# Patient Record
Sex: Male | Born: 1980 | Race: Black or African American | Hispanic: No | Marital: Single
Health system: Southern US, Community
[De-identification: ages and names within clinical notes are randomized; demographics above are authoritative.]

---

## 2004-04-04 ENCOUNTER — Ambulatory Visit: Payer: Self-pay | Admitting: Internal Medicine

## 2004-04-14 ENCOUNTER — Ambulatory Visit (HOSPITAL_COMMUNITY): Admission: RE | Admit: 2004-04-14 | Discharge: 2004-04-14 | Payer: Self-pay | Admitting: Internal Medicine

## 2010-08-18 ENCOUNTER — Ambulatory Visit: Payer: Self-pay | Admitting: Adult Health

## 2010-11-28 ENCOUNTER — Inpatient Hospital Stay (HOSPITAL_COMMUNITY)
Admission: EM | Admit: 2010-11-28 | Discharge: 2010-12-19 | DRG: 975 | Disposition: A | Discharge status: Home / Self Care | Payer: Medicaid Other | Attending: Internal Medicine | Admitting: Internal Medicine

## 2010-11-28 ENCOUNTER — Encounter (HOSPITAL_COMMUNITY): Payer: Self-pay

## 2010-11-28 ENCOUNTER — Emergency Department (HOSPITAL_COMMUNITY): Payer: Medicaid Other

## 2010-11-28 ENCOUNTER — Inpatient Hospital Stay (HOSPITAL_COMMUNITY): Payer: Medicaid Other

## 2010-11-28 DIAGNOSIS — D709 Neutropenia, unspecified: Secondary | ICD-10-CM | POA: Diagnosis not present

## 2010-11-28 DIAGNOSIS — T375X5A Adverse effect of antiviral drugs, initial encounter: Secondary | ICD-10-CM | POA: Diagnosis not present

## 2010-11-28 DIAGNOSIS — K2990 Gastroduodenitis, unspecified, without bleeding: Secondary | ICD-10-CM | POA: Diagnosis present

## 2010-11-28 DIAGNOSIS — K2210 Ulcer of esophagus without bleeding: Secondary | ICD-10-CM | POA: Diagnosis present

## 2010-11-28 DIAGNOSIS — J189 Pneumonia, unspecified organism: Secondary | ICD-10-CM | POA: Diagnosis present

## 2010-11-28 DIAGNOSIS — Z79899 Other long term (current) drug therapy: Secondary | ICD-10-CM

## 2010-11-28 DIAGNOSIS — R5081 Fever presenting with conditions classified elsewhere: Secondary | ICD-10-CM | POA: Diagnosis present

## 2010-11-28 DIAGNOSIS — B20 Human immunodeficiency virus [HIV] disease: Secondary | ICD-10-CM

## 2010-11-28 DIAGNOSIS — R7402 Elevation of levels of lactic acid dehydrogenase (LDH): Secondary | ICD-10-CM | POA: Diagnosis not present

## 2010-11-28 DIAGNOSIS — D509 Iron deficiency anemia, unspecified: Secondary | ICD-10-CM | POA: Diagnosis present

## 2010-11-28 DIAGNOSIS — C464 Kaposi's sarcoma of gastrointestinal sites: Secondary | ICD-10-CM | POA: Diagnosis present

## 2010-11-28 DIAGNOSIS — G9349 Other encephalopathy: Secondary | ICD-10-CM | POA: Diagnosis present

## 2010-11-28 DIAGNOSIS — K2970 Gastritis, unspecified, without bleeding: Secondary | ICD-10-CM | POA: Diagnosis present

## 2010-11-28 DIAGNOSIS — B370 Candidal stomatitis: Secondary | ICD-10-CM | POA: Diagnosis present

## 2010-11-28 DIAGNOSIS — C460 Kaposi's sarcoma of skin: Secondary | ICD-10-CM | POA: Diagnosis present

## 2010-11-28 DIAGNOSIS — B259 Cytomegaloviral disease, unspecified: Secondary | ICD-10-CM | POA: Diagnosis present

## 2010-11-28 DIAGNOSIS — C469 Kaposi's sarcoma, unspecified: Secondary | ICD-10-CM

## 2010-11-28 DIAGNOSIS — I498 Other specified cardiac arrhythmias: Secondary | ICD-10-CM | POA: Diagnosis present

## 2010-11-28 DIAGNOSIS — R195 Other fecal abnormalities: Secondary | ICD-10-CM | POA: Diagnosis present

## 2010-11-28 DIAGNOSIS — K209 Esophagitis, unspecified without bleeding: Secondary | ICD-10-CM | POA: Diagnosis present

## 2010-11-28 DIAGNOSIS — R7401 Elevation of levels of liver transaminase levels: Secondary | ICD-10-CM | POA: Diagnosis not present

## 2010-11-28 DIAGNOSIS — E236 Other disorders of pituitary gland: Secondary | ICD-10-CM | POA: Diagnosis not present

## 2010-11-28 DIAGNOSIS — F4321 Adjustment disorder with depressed mood: Secondary | ICD-10-CM | POA: Diagnosis present

## 2010-11-28 LAB — OCCULT BLOOD, POC DEVICE: Fecal Occult Bld: POSITIVE

## 2010-11-28 LAB — CSF CELL COUNT WITH DIFFERENTIAL
RBC Count, CSF: 242 /mm3 — ABNORMAL HIGH
RBC Count, CSF: 3 /mm3 — ABNORMAL HIGH
Tube #: 1
Tube #: 4
WBC, CSF: 2 /mm3 (ref 0–5)
WBC, CSF: 2 /mm3 (ref 0–5)

## 2010-11-28 LAB — URINE MICROSCOPIC-ADD ON

## 2010-11-28 LAB — IRON AND TIBC
Iron: <10 ug/dL — ABNORMAL LOW (ref 42–135)
UIBC: 160 ug/dL

## 2010-11-28 LAB — DIFFERENTIAL
Basophils Absolute: 0 10*3/uL (ref 0.0–0.1)
Basophils Relative: 0 % (ref 0–1)
Eosinophils Absolute: 0 10*3/uL (ref 0.0–0.7)
Eosinophils Relative: 0 % (ref 0–5)
Lymphocytes Relative: 18 % (ref 12–46)
Lymphs Abs: 1 10*3/uL (ref 0.7–4.0)
Monocytes Absolute: 0.6 10*3/uL (ref 0.1–1.0)
Monocytes Relative: 11 % (ref 3–12)
Neutro Abs: 3.9 10*3/uL (ref 1.7–7.7)

## 2010-11-28 LAB — BASIC METABOLIC PANEL
BUN: 15 mg/dL (ref 6–23)
CO2: 35 mEq/L — ABNORMAL HIGH (ref 19–32)
Calcium: 9.7 mg/dL (ref 8.4–10.5)
Chloride: 84 mEq/L — ABNORMAL LOW (ref 96–112)
Creatinine, Ser: 1.16 mg/dL (ref 0.50–1.35)
GFR calc Af Amer: >60 mL/min (ref >60)
GFR calc non Af Amer: >60 mL/min (ref >60)
Glucose, Bld: 116 mg/dL — ABNORMAL HIGH (ref 70–99)
Potassium: 3 mEq/L — ABNORMAL LOW (ref 3.5–5.1)
Sodium: 129 mEq/L — ABNORMAL LOW (ref 135–145)

## 2010-11-28 LAB — RAPID URINE DRUG SCREEN, HOSP PERFORMED
Amphetamines: NOT DETECTED
Barbiturates: NOT DETECTED
Benzodiazepines: NOT DETECTED
Cocaine: NOT DETECTED
Opiates: NOT DETECTED
Tetrahydrocannabinol: NOT DETECTED

## 2010-11-28 LAB — LACTIC ACID, PLASMA: Lactic Acid, Venous: 1.4 mmol/L (ref 0.5–2.2)

## 2010-11-28 LAB — CBC
HCT: 23.6 % — ABNORMAL LOW (ref 39.0–52.0)
Hemoglobin: 7.3 g/dL — ABNORMAL LOW (ref 13.0–17.0)
MCH: 25.3 pg — ABNORMAL LOW (ref 26.0–34.0)
MCHC: 30.9 g/dL (ref 30.0–36.0)
MCV: 81.9 fL (ref 78.0–100.0)
Platelets: 398 10*3/uL (ref 150–400)
RBC: 2.88 MIL/uL — ABNORMAL LOW (ref 4.22–5.81)
RDW: 16.8 % — ABNORMAL HIGH (ref 11.5–15.5)
WBC: 5.5 10*3/uL (ref 4.0–10.5)

## 2010-11-28 LAB — HIV ANTIBODY (ROUTINE TESTING W REFLEX): HIV: REACTIVE — AB

## 2010-11-28 LAB — GRAM STAIN

## 2010-11-28 LAB — CK: Total CK: 786 U/L — ABNORMAL HIGH (ref 7–232)

## 2010-11-28 LAB — HEMOGLOBIN AND HEMATOCRIT, BLOOD: Hemoglobin: 5.8 g/dL — CL (ref 13.0–17.0)

## 2010-11-28 LAB — URINALYSIS, ROUTINE W REFLEX MICROSCOPIC
Glucose, UA: NEGATIVE mg/dL
Leukocytes, UA: NEGATIVE
Nitrite: NEGATIVE
Protein, ur: 100 mg/dL — AB
Specific Gravity, Urine: 1.023 (ref 1.005–1.030)
Urobilinogen, UA: 1 mg/dL (ref 0.0–1.0)
pH: 6 (ref 5.0–8.0)

## 2010-11-28 LAB — HEPATIC FUNCTION PANEL
ALT: 12 U/L (ref 0–53)
AST: 41 U/L — ABNORMAL HIGH (ref 0–37)
Albumin: 2.4 g/dL — ABNORMAL LOW (ref 3.5–5.2)
Alkaline Phosphatase: 32 U/L — ABNORMAL LOW (ref 39–117)
Bilirubin, Direct: 0.2 mg/dL (ref 0.0–0.3)
Indirect Bilirubin: 0.2 mg/dL — ABNORMAL LOW (ref 0.3–0.9)
Total Bilirubin: 0.4 mg/dL (ref 0.3–1.2)
Total Protein: 6.8 g/dL (ref 6.0–8.3)

## 2010-11-28 LAB — RETICULOCYTES
RBC.: 2.27 MIL/uL — ABNORMAL LOW (ref 4.22–5.81)
Retic Count, Absolute: 40.9 10*3/uL (ref 19.0–186.0)

## 2010-11-28 LAB — GLUCOSE, CAPILLARY: Glucose-Capillary: 106 mg/dL — ABNORMAL HIGH (ref 70–99)

## 2010-11-28 LAB — TSH: TSH: 0.732 u[IU]/mL (ref 0.350–4.500)

## 2010-11-28 LAB — PROTEIN, CSF: Total  Protein, CSF: 32 mg/dL (ref 15–45)

## 2010-11-28 LAB — ABO/RH: ABO/RH(D): O POS

## 2010-11-28 LAB — GLUCOSE, CSF: Glucose, CSF: 54 mg/dL (ref 43–76)

## 2010-11-28 LAB — FERRITIN: Ferritin: 2450 ng/mL — ABNORMAL HIGH (ref 22–322)

## 2010-11-28 MED ORDER — GADOBENATE DIMEGLUMINE 529 MG/ML IV SOLN
17.0000 mL | Freq: Once | INTRAVENOUS | Status: AC | PRN
  Administered 2010-11-28: 17 mL via INTRAVENOUS

## 2010-11-29 ENCOUNTER — Inpatient Hospital Stay (HOSPITAL_COMMUNITY): Payer: Medicaid Other

## 2010-11-29 LAB — CROSSMATCH
ABO/RH(D): O POS
Unit division: 0

## 2010-11-29 LAB — HEMOGLOBIN AND HEMATOCRIT, BLOOD
HCT: 26.6 % — ABNORMAL LOW (ref 39.0–52.0)
Hemoglobin: 8.4 g/dL — ABNORMAL LOW (ref 13.0–17.0)

## 2010-11-29 LAB — GC/CHLAMYDIA PROBE AMP, URINE: GC Probe Amp, Urine: NEGATIVE

## 2010-11-29 LAB — HERPES SIMPLEX VIRUS(HSV) DNA BY PCR

## 2010-11-29 MED ORDER — IOHEXOL 300 MG/ML  SOLN
125.0000 mL | Freq: Once | INTRAMUSCULAR | Status: AC | PRN
  Administered 2010-11-29: 125 mL via INTRAVENOUS

## 2010-11-30 LAB — CBC
HCT: 26.4 % — ABNORMAL LOW (ref 39.0–52.0)
Hemoglobin: 8.4 g/dL — ABNORMAL LOW (ref 13.0–17.0)
MCHC: 31.8 g/dL (ref 30.0–36.0)
RBC: 3.1 MIL/uL — ABNORMAL LOW (ref 4.22–5.81)
WBC: 3.5 10*3/uL — ABNORMAL LOW (ref 4.0–10.5)

## 2010-11-30 LAB — DIFFERENTIAL
Basophils Absolute: 0 10*3/uL (ref 0.0–0.1)
Eosinophils Relative: 0 % (ref 0–5)
Lymphocytes Relative: 11 % — ABNORMAL LOW (ref 12–46)
Monocytes Relative: 9 % (ref 3–12)
Neutrophils Relative %: 80 % — ABNORMAL HIGH (ref 43–77)

## 2010-11-30 LAB — BASIC METABOLIC PANEL
CO2: 31 mEq/L (ref 19–32)
Chloride: 99 mEq/L (ref 96–112)
Creatinine, Ser: 0.66 mg/dL (ref 0.50–1.35)
GFR calc Af Amer: >60 mL/min (ref >60)
Sodium: 136 mEq/L (ref 135–145)

## 2010-11-30 LAB — PROTIME-INR
INR: 1.07 (ref 0.00–1.49)
Prothrombin Time: 14.1 seconds (ref 11.6–15.2)

## 2010-12-01 DIAGNOSIS — F329 Major depressive disorder, single episode, unspecified: Secondary | ICD-10-CM

## 2010-12-01 DIAGNOSIS — B20 Human immunodeficiency virus [HIV] disease: Secondary | ICD-10-CM

## 2010-12-01 DIAGNOSIS — M7989 Other specified soft tissue disorders: Secondary | ICD-10-CM

## 2010-12-01 DIAGNOSIS — C469 Kaposi's sarcoma, unspecified: Secondary | ICD-10-CM

## 2010-12-01 DIAGNOSIS — F3289 Other specified depressive episodes: Secondary | ICD-10-CM

## 2010-12-01 LAB — COMPREHENSIVE METABOLIC PANEL
BUN: 11 mg/dL (ref 6–23)
CO2: 29 mEq/L (ref 19–32)
Calcium: 9 mg/dL (ref 8.4–10.5)
Chloride: 101 mEq/L (ref 96–112)
Creatinine, Ser: 0.71 mg/dL (ref 0.50–1.35)
GFR calc non Af Amer: >60 mL/min (ref >60)
Total Bilirubin: 0.4 mg/dL (ref 0.3–1.2)

## 2010-12-01 LAB — CBC
HCT: 25.8 % — ABNORMAL LOW (ref 39.0–52.0)
MCH: 27.2 pg (ref 26.0–34.0)
MCV: 86.6 fL (ref 78.0–100.0)
RBC: 2.98 MIL/uL — ABNORMAL LOW (ref 4.22–5.81)
RDW: 18.3 % — ABNORMAL HIGH (ref 11.5–15.5)
WBC: 2.3 10*3/uL — ABNORMAL LOW (ref 4.0–10.5)

## 2010-12-01 LAB — T-HELPER CELLS (CD4) COUNT (NOT AT ARMC): CD4 T Cell Abs: 10 uL — ABNORMAL LOW (ref 400–2700)

## 2010-12-01 LAB — B. BURGDORFI ANTIBODIES: B burgdorferi Ab IgG+IgM: 0.22 {ISR}

## 2010-12-01 LAB — CSF CULTURE W GRAM STAIN

## 2010-12-02 ENCOUNTER — Inpatient Hospital Stay (HOSPITAL_COMMUNITY): Payer: Medicaid Other

## 2010-12-02 LAB — HIV-1 RNA ULTRAQUANT REFLEX TO GENTYP+: HIV-1 RNA Quant, Log: 4.92 {Log} — ABNORMAL HIGH (ref <1.30)

## 2010-12-02 LAB — CBC
HCT: 25.2 % — ABNORMAL LOW (ref 39.0–52.0)
Hemoglobin: 8.1 g/dL — ABNORMAL LOW (ref 13.0–17.0)
MCH: 27.3 pg (ref 26.0–34.0)
MCHC: 32.1 g/dL (ref 30.0–36.0)
MCV: 84.8 fL (ref 78.0–100.0)

## 2010-12-02 LAB — COMPREHENSIVE METABOLIC PANEL
AST: 25 U/L (ref 0–37)
BUN: 10 mg/dL (ref 6–23)
CO2: 27 mEq/L (ref 19–32)
Chloride: 96 mEq/L (ref 96–112)
Creatinine, Ser: 0.65 mg/dL (ref 0.50–1.35)
GFR calc Af Amer: >60 mL/min (ref >60)
GFR calc non Af Amer: >60 mL/min (ref >60)
Glucose, Bld: 101 mg/dL — ABNORMAL HIGH (ref 70–99)
Total Bilirubin: 0.3 mg/dL (ref 0.3–1.2)

## 2010-12-02 LAB — HIV 1/2 CONFIRMATION: HIV-1 antibody: POSITIVE

## 2010-12-02 LAB — HEMOGLOBINOPATHY EVALUATION
Hemoglobin Other: 0 % (ref 0.0–0.0)
Hgb A2 Quant: 3.3 % — ABNORMAL HIGH (ref 2.2–3.2)

## 2010-12-02 LAB — OCCULT BLOOD X 1 CARD TO LAB, STOOL: Fecal Occult Bld: NEGATIVE

## 2010-12-03 ENCOUNTER — Other Ambulatory Visit: Payer: Self-pay | Admitting: Gastroenterology

## 2010-12-03 ENCOUNTER — Other Ambulatory Visit (HOSPITAL_COMMUNITY): Payer: Self-pay

## 2010-12-03 ENCOUNTER — Other Ambulatory Visit (INDEPENDENT_AMBULATORY_CARE_PROVIDER_SITE_OTHER): Payer: Self-pay | Admitting: General Surgery

## 2010-12-03 DIAGNOSIS — C469 Kaposi's sarcoma, unspecified: Secondary | ICD-10-CM

## 2010-12-03 DIAGNOSIS — R229 Localized swelling, mass and lump, unspecified: Secondary | ICD-10-CM

## 2010-12-03 LAB — COMPREHENSIVE METABOLIC PANEL
ALT: 14 U/L (ref 0–53)
Alkaline Phosphatase: 46 U/L (ref 39–117)
CO2: 26 mEq/L (ref 19–32)
Calcium: 9.1 mg/dL (ref 8.4–10.5)
Chloride: 95 mEq/L — ABNORMAL LOW (ref 96–112)
GFR calc Af Amer: >60 mL/min (ref >60)
GFR calc non Af Amer: >60 mL/min (ref >60)
Glucose, Bld: 97 mg/dL (ref 70–99)
Sodium: 129 mEq/L — ABNORMAL LOW (ref 135–145)
Total Bilirubin: 0.4 mg/dL (ref 0.3–1.2)

## 2010-12-03 LAB — CBC
Hemoglobin: 8.4 g/dL — ABNORMAL LOW (ref 13.0–17.0)
MCH: 26.3 pg (ref 26.0–34.0)
RBC: 3.19 MIL/uL — ABNORMAL LOW (ref 4.22–5.81)
WBC: 3.4 10*3/uL — ABNORMAL LOW (ref 4.0–10.5)

## 2010-12-03 LAB — OCCULT BLOOD X 1 CARD TO LAB, STOOL: Fecal Occult Bld: POSITIVE

## 2010-12-04 ENCOUNTER — Encounter (HOSPITAL_COMMUNITY): Payer: Self-pay | Admitting: Radiology

## 2010-12-04 ENCOUNTER — Inpatient Hospital Stay (HOSPITAL_COMMUNITY): Payer: Medicaid Other

## 2010-12-04 DIAGNOSIS — B20 Human immunodeficiency virus [HIV] disease: Secondary | ICD-10-CM

## 2010-12-04 LAB — CULTURE, BLOOD (ROUTINE X 2)
Culture  Setup Time: 201207130916
Culture: NO GROWTH

## 2010-12-04 LAB — CBC
HCT: 22.8 % — ABNORMAL LOW (ref 39.0–52.0)
Hemoglobin: 7.5 g/dL — ABNORMAL LOW (ref 13.0–17.0)
MCHC: 32.9 g/dL (ref 30.0–36.0)
MCV: 82.9 fL (ref 78.0–100.0)
RDW: 18 % — ABNORMAL HIGH (ref 11.5–15.5)

## 2010-12-04 LAB — DIFFERENTIAL
Basophils Absolute: 0 10*3/uL (ref 0.0–0.1)
Basophils Relative: 0 % (ref 0–1)
Eosinophils Absolute: 0 10*3/uL (ref 0.0–0.7)
Lymphocytes Relative: 14 % (ref 12–46)
Lymphs Abs: 0.4 10*3/uL — ABNORMAL LOW (ref 0.7–4.0)
Monocytes Absolute: 0.4 10*3/uL (ref 0.1–1.0)
Neutro Abs: 1.9 10*3/uL (ref 1.7–7.7)

## 2010-12-04 LAB — COMPREHENSIVE METABOLIC PANEL
Albumin: 1.9 g/dL — ABNORMAL LOW (ref 3.5–5.2)
Alkaline Phosphatase: 37 U/L — ABNORMAL LOW (ref 39–117)
BUN: 8 mg/dL (ref 6–23)
Creatinine, Ser: 0.72 mg/dL (ref 0.50–1.35)
GFR calc Af Amer: >60 mL/min (ref >60)
Glucose, Bld: 100 mg/dL — ABNORMAL HIGH (ref 70–99)
Total Bilirubin: 0.3 mg/dL (ref 0.3–1.2)
Total Protein: 5.9 g/dL — ABNORMAL LOW (ref 6.0–8.3)

## 2010-12-04 MED ORDER — TECHNETIUM TC 99M-LABELED RED BLOOD CELLS IV KIT
24.1000 | PACK | Freq: Once | INTRAVENOUS | Status: AC | PRN
  Administered 2010-12-04: 24.1 via INTRAVENOUS

## 2010-12-05 ENCOUNTER — Other Ambulatory Visit: Payer: Self-pay | Admitting: Interventional Radiology

## 2010-12-05 ENCOUNTER — Inpatient Hospital Stay (HOSPITAL_COMMUNITY): Payer: Medicaid Other

## 2010-12-05 LAB — COMPREHENSIVE METABOLIC PANEL
ALT: 10 U/L (ref 0–53)
AST: 23 U/L (ref 0–37)
Alkaline Phosphatase: 45 U/L (ref 39–117)
CO2: 23 mEq/L (ref 19–32)
Chloride: 98 mEq/L (ref 96–112)
GFR calc non Af Amer: >60 mL/min (ref >60)
Glucose, Bld: 110 mg/dL — ABNORMAL HIGH (ref 70–99)
Sodium: 127 mEq/L — ABNORMAL LOW (ref 135–145)
Total Bilirubin: 0.2 mg/dL — ABNORMAL LOW (ref 0.3–1.2)

## 2010-12-05 LAB — CBC
HCT: 26.3 % — ABNORMAL LOW (ref 39.0–52.0)
Hemoglobin: 8.7 g/dL — ABNORMAL LOW (ref 13.0–17.0)
MCV: 82.2 fL (ref 78.0–100.0)
RBC: 3.2 MIL/uL — ABNORMAL LOW (ref 4.22–5.81)
WBC: 3.5 10*3/uL — ABNORMAL LOW (ref 4.0–10.5)

## 2010-12-05 LAB — CARDIAC PANEL(CRET KIN+CKTOT+MB+TROPI)
Relative Index: INVALID (ref 0.0–2.5)
Troponin I: <0.3 ng/mL (ref <0.30)

## 2010-12-05 LAB — CROSSMATCH
ABO/RH(D): O POS
Antibody Screen: NEGATIVE
Unit division: 0

## 2010-12-05 LAB — HERPES SIMPLEX VIRUS CULTURE

## 2010-12-05 LAB — TSH: TSH: 0.346 u[IU]/mL — ABNORMAL LOW (ref 0.350–4.500)

## 2010-12-05 NOTE — Progress Notes (Signed)
[name], [name]              ACCOUNT NO.:  [id number]  MEDICAL RECORD NO.:  [id number]  LOCATION:  1511                         FACILITY:  [facility]  PHYSICIAN:  Pleas Koch, MD        DATE OF BIRTH:  [birth year]                                PROGRESS NOTE   DIAGNOSES TO DATE: 1. 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  Subtype undertermined. 2. 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 3. 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 4. Multimodal etiology to anemia.  MEDICATIONS TO DATE: 1. Atripla 1 tablet q.h.s. 2. Ferrous sulfate 650 daily. 3. Fluconazole 100 mg daily. 4. Sucralfate 1 tablet q.i.d. 5. Tylenol 650 p.r.n. 6. Tramadol 50 q.8 p.r.n.Marland Kitchen  PERTINENT IMAGING STUDIES: CT head on November 28, 2010, showed no acute evidence of intracranial hemorrhage, mass lesion or acute infarct.  MRA of head on November 28, 2010, showed background pattern of brain atrophy.  Restrict effusion, perhaps low-level enhancement and region of lateral ventricle, more than right could represent ventriculitis or ependymal inflammation.  This could be due to atypical organism including cytomegalovirus.  Paranasal sinus inflammation including mastoid air cells on the right.  CT contrast of the groin on November 29, 2010, showed subcutaneous edema along the inguinal region.  Do not discern overadenopathy, although subcutaneous edema could be manifestation of hepatic obstruction that can occur in Kaposi sarcoma. Nonspecific hypodense lesion, L4 vertebral body on the right, possibly manifestation of Kaposi sarcoma, cannot exclude if this could be simply a meningioma.  CT chest showed a patchy ground-glass opacity causing mosaic attenuation, pain-associated nodularity.  Although appearance does not have peribronchial-perivascular nodularity affecting Kaposi sarcoma, this can occasionally present with ground-glass opacities  indicating hemorrhage around micronodules.  HISTORY: This is an unfortunate 30 year old male who presented to [facility] ED with altered mental status.  He was found outside the house late in the evening wandering around the garden.  He was not eating and appeared to be confused.  Mother who lives away was contacted and got him to the hospital.  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  He has labelled defensive attitude.  He did complain of right buttock area rash, it is painful but is crusted over.  He had a hemoglobin of 7.3 initially.  Urinalysis negative.  Serum sodium was 129, potassium 3.0. He also presented with fever of 103 and vitals otherwise stable.  LP was performed, partial result showed 2 WBCs, 242 RBCs.  The patient initially denied but subsequently admitted to be homosexual and he has known that he has had HIV since 2000, never wanted treatment for same, never pursued the same.  HOSPITAL COURSE ACCORDING TO ISSUES: 1. HIV/AIDS.  ID is on board.  He is currently on day #3 of Atripla,     day #4 of Diflucan for esophageal candidiasis.  He will need to     continue on HAART.  His CD-4 count has returned at 10.  He has had     a multitude of tests including  CSF studies as well as viral studies     which have been turned out to be negative.  We are still awaiting a     genotype. 2. Kaposi sarcoma.  We enlisted the help of Dr. Myna Hidalgo with Oncology.     He has seen the patient and has recommended skin biopsy which we     will hold on for now. 3. Anemia.  The patient is significantly anemic still and guaiacs     stool positive x1.  I have spoken with Dr. Bosie Clos of     Gastroenterology who agrees that the patient may need a scope.  He     has had iron level less than 10 and this likely is secondary to his     significant immunocompromised self with possible invasion of the     bone  marrow with Kaposi.  The patient may benefit from being on     some specific chemotherapy for this.  Dr. Myna Hidalgo to decide on the     same, in terms of Doxil. 4. Depression.  We consulted Dr. Rogers Blocker of Psychiatry who recommend     outpatient followup for the same.  This patient will need possibly     to have his care coordinated as the patient's family has determined     that it would be best to keep him with them at [facility].  The patient doing fairly well on day of review, December 02, 2010, although he had a slight temperature of 99.  He also had some hyponatremia on his labs at 131.  He said he has been coughing and we are going to get a chest x-ray for that.  Chest was clinically clear.  I did not hear any focal fremitus.  I did hear some crackles in the right lower lobe. Abdomen is soft, nontender, nondistended.  He has a rash on his bottom which I am getting wound care consult for.  He was a little bit confused yesterday but has now been sober today.  Per recommendations of ID, I am adding azithromycin and Bactrim to his treatment for prophylaxis.  The patient will be followed on day-to-day basis.  Dr. Janee Morn will be seeing him tomorrow. ______________________________ Pleas Koch, MD     JS/MEDQ  D:  12/02/2010  T:  12/02/2010  Job:  [id number]  Electronically Signed by Pleas Koch MD on 12/05/2010 09:25:45 PM

## 2010-12-06 DIAGNOSIS — B20 Human immunodeficiency virus [HIV] disease: Secondary | ICD-10-CM

## 2010-12-06 DIAGNOSIS — F329 Major depressive disorder, single episode, unspecified: Secondary | ICD-10-CM

## 2010-12-06 DIAGNOSIS — F3289 Other specified depressive episodes: Secondary | ICD-10-CM

## 2010-12-06 DIAGNOSIS — C469 Kaposi's sarcoma, unspecified: Secondary | ICD-10-CM

## 2010-12-06 LAB — DIFFERENTIAL
Basophils Relative: 0 % (ref 0–1)
Eosinophils Relative: 1 % (ref 0–5)
Lymphs Abs: 0.5 10*3/uL — ABNORMAL LOW (ref 0.7–4.0)
Monocytes Absolute: 0.2 10*3/uL (ref 0.1–1.0)

## 2010-12-06 LAB — CBC
HCT: 27.5 % — ABNORMAL LOW (ref 39.0–52.0)
Hemoglobin: 8.9 g/dL — ABNORMAL LOW (ref 13.0–17.0)
MCV: 82.3 fL (ref 78.0–100.0)
RDW: 18 % — ABNORMAL HIGH (ref 11.5–15.5)
WBC: 4.3 10*3/uL (ref 4.0–10.5)

## 2010-12-06 LAB — COMPREHENSIVE METABOLIC PANEL
ALT: 11 U/L (ref 0–53)
AST: 26 U/L (ref 0–37)
Albumin: 1.8 g/dL — ABNORMAL LOW (ref 3.5–5.2)
Alkaline Phosphatase: 59 U/L (ref 39–117)
BUN: 7 mg/dL (ref 6–23)
Chloride: 100 mEq/L (ref 96–112)
Potassium: 4 mEq/L (ref 3.5–5.1)
Sodium: 129 mEq/L — ABNORMAL LOW (ref 135–145)
Total Bilirubin: 0.2 mg/dL — ABNORMAL LOW (ref 0.3–1.2)

## 2010-12-06 LAB — CARDIAC PANEL(CRET KIN+CKTOT+MB+TROPI)
Total CK: 56 U/L (ref 7–232)
Troponin I: <0.3 ng/mL (ref <0.30)

## 2010-12-06 NOTE — Consult Note (Signed)
NAMEKENARD, [name]              ACCOUNT NO.:  [id number]  MEDICAL RECORD NO.:  [id number]  LOCATION:                                 FACILITY:  PHYSICIAN:  Josph Macho, M.D.  DATE OF BIRTH:  06-Jan-1981  DATE OF CONSULTATION:  11/28/2010 DATE OF DISCHARGE:                                CONSULTATION   REFERRING PHYSICIAN:  Pleas Koch, MD.  REASON FOR CONSULTATION: 1. AIDS associated Kaposi sarcoma. 2. Severe anemia.  HISTORY OF PRESENT ILLNESS:  [name] is a 30 year old African gentleman.  He is a homosexual.  He has had about 50 partners.  He apparently tested positive for HIV back in 2003.  He has not had any treatment for this.  He unfortunately came in with weight loss, fever, and mental status changes.  He did have an LP done.  He had routine MRI of the brain done.  The MRI of the brain showed some atrophy.  There was some low-level enhancement noted.  No obvious mass or bleed was noted.  He had lab work done.  He was admitted on the 13th.  He was noted to have occult blood in his stool.  A CBC was done, showed a white count 5.5, hemoglobin 7.2, hematocrit 23.6, platelet count 398.  MCV was 82. He had a reticulocyte count which was low at 1.8%, corrected and this be less than 1%.  He had metabolic panel which showed BUN of 15, creatinine 1.1.  Sodium 129, potassium 3.0.  His LFTs were normal.  Iron studies showed total iron less than 10.  His TSH was 0.732.  His ferritin was 2450, this is likely reflective of acute phase reactant.  He was noted to have some suspicious lesions on the skin.  He was seen by Dr. Ninetta Lights.  Dr. Ninetta Lights felt that these were all consistent with Kaposi.  He also was noted to have thrush.  He subsequently was asked to be seen by Hematology.  We are now seeing him.  There is some degree of encephalopathy.  He recently lost his job. Apparently he has been "depressed".  PAST MEDICAL HISTORY:  Relatively, I think,  unremarkable.  ALLERGIES:  None.  ADMISSION MEDICATIONS:  None.  SOCIAL HISTORY:  Negative for tobacco use.  There is no alcohol use.  He denies any recreational drug use.  FAMILY HISTORY:  Remarkable for diabetes.  REVIEW OF SYSTEMS:  As stated in history of present illness.  PHYSICAL EXAMINATION:  GENERAL:  This is a fairly well-developed, well- nourished African-American gentleman, in no obvious distress. VITAL SIGNS:  Temperature 98, pulse 98, respiratory rate 18, and blood pressure 119/74. HEENT:  Head and neck exam shows normocephalic and atraumatic skull. There are no ocular or oral lesions.  There is no scleral icterus.  He does have significant thrush.  I do not detect any obvious adenopathy in his neck. LUNGS:  His lungs are clear bilaterally. CARDIAC EXAM:  Regular rate and rhythm with normal S1, S2.  There are no murmurs, rubs or bruits.  Axillary exam shows no bilateral axillary adenopathy. ABDOMEN:  Abdominal exam is soft with good bowel sounds.  There is no fluid wave.  There is no abdominal mass.  There is no palpable hepatosplenomegaly. EXTREMITIES:  Showed some muscle atrophy in upper and lower extremities. SKIN EXAM:  Does show scattered violaceous plaques.  He has some on his face.  He has some on his extremities.  There are a couple on his gluteal region. NEUROLOGICAL EXAM:  Showed little bit of flat affect.  He is somewhat oriented and alert.  On his peripheral smear, he has marked anisocytosis.  He has couple enucleated red cells.  He has moderate microcytic red cells.  There is no hypersegmented polys.  I do not see any blasts.  His platelets are adequate in number and size.  He has few large platelets.  IMPRESSION:  [name] 30 year old gentleman with what certainly appears to be age-associated disease.  He apparently was HIV positive back in 2003.  It is amazing how he has gotten this long without having more in the way of problems.  He clearly  has fulfilled criteria for AIDS at this point in time.  I suspect that he has Kaposi sarcoma.  I think we will have to get Surgery to biopsy one of these lesions.  He is heme positive.  I think he need to be scoped from upper endoscopy and also colonoscopy.  We will see if he has Kaposi within his GI tract. He also may need to have capsule endoscopy to look at his small bowel.  I suspect that he is quite iron deficient.  However, he is still going to need to have a bone marrow biopsy done to make sure there is no opportunistic infection, malignancy, Kaposi, etc. in his bone marrow. 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  I think he is going to get a CT scan of his neck, chest, abdomen and pelvis looking for any evidence of Kaposi and also other AIDS associated malignancies.  I think once he is on appropriate  HAART  therapy, he will start improving.  It is typical that the Kaposi lesion regress with AIDS therapy alone.  There was one randomized study that did show that the addition of chemotherapy (Doxil) with HAART  therapy is more effective.  We will certainly follow along as necessary.  There is certainly an extensive workup that does need to be undertaken.     Josph Macho, M.D.     PRE/MEDQ  D:  11/28/2010  T:  11/28/2010  Job:  [id number]  Electronically Signed by Arlan Organ  on 12/06/2010 09:13:23 AM

## 2010-12-07 LAB — CBC
MCH: 27.1 pg (ref 26.0–34.0)
MCHC: 32.8 g/dL (ref 30.0–36.0)
MCV: 82.5 fL (ref 78.0–100.0)
Platelets: 277 10*3/uL (ref 150–400)
RBC: 3.32 MIL/uL — ABNORMAL LOW (ref 4.22–5.81)

## 2010-12-07 LAB — COMPREHENSIVE METABOLIC PANEL
AST: 23 U/L (ref 0–37)
CO2: 24 mEq/L (ref 19–32)
Calcium: 8.5 mg/dL (ref 8.4–10.5)
Creatinine, Ser: 0.76 mg/dL (ref 0.50–1.35)
GFR calc Af Amer: >60 mL/min (ref >60)
GFR calc non Af Amer: >60 mL/min (ref >60)
Glucose, Bld: 116 mg/dL — ABNORMAL HIGH (ref 70–99)
Total Protein: 6.2 g/dL (ref 6.0–8.3)

## 2010-12-07 LAB — DIFFERENTIAL
Band Neutrophils: 0 % (ref 0–10)
Basophils Absolute: 0 10*3/uL (ref 0.0–0.1)
Basophils Relative: 0 % (ref 0–1)
Blasts: 0 %
Eosinophils Absolute: 0 10*3/uL (ref 0.0–0.7)
Eosinophils Relative: 0 % (ref 0–5)
Lymphocytes Relative: 15 % (ref 12–46)
Lymphs Abs: 0.5 10*3/uL — ABNORMAL LOW (ref 0.7–4.0)
Metamyelocytes Relative: 0 %
Monocytes Absolute: 0.1 10*3/uL (ref 0.1–1.0)
Monocytes Relative: 2 % — ABNORMAL LOW (ref 3–12)

## 2010-12-08 DIAGNOSIS — B20 Human immunodeficiency virus [HIV] disease: Secondary | ICD-10-CM

## 2010-12-08 DIAGNOSIS — C469 Kaposi's sarcoma, unspecified: Secondary | ICD-10-CM

## 2010-12-08 LAB — COMPREHENSIVE METABOLIC PANEL
ALT: 21 U/L (ref 0–53)
Alkaline Phosphatase: 72 U/L (ref 39–117)
CO2: 22 mEq/L (ref 19–32)
Chloride: 96 mEq/L (ref 96–112)
GFR calc Af Amer: >60 mL/min (ref >60)
GFR calc non Af Amer: >60 mL/min (ref >60)
Glucose, Bld: 95 mg/dL (ref 70–99)
Potassium: 4 mEq/L (ref 3.5–5.1)
Sodium: 127 mEq/L — ABNORMAL LOW (ref 135–145)

## 2010-12-08 LAB — URINALYSIS, ROUTINE W REFLEX MICROSCOPIC
Bilirubin Urine: NEGATIVE
Ketones, ur: NEGATIVE mg/dL
Nitrite: NEGATIVE
Urobilinogen, UA: 0.2 mg/dL (ref 0.0–1.0)
pH: 6.5 (ref 5.0–8.0)

## 2010-12-08 LAB — DIFFERENTIAL
Basophils Absolute: 0 10*3/uL (ref 0.0–0.1)
Lymphocytes Relative: 26 % (ref 12–46)
Monocytes Relative: 7 % (ref 3–12)

## 2010-12-08 LAB — URINE MICROSCOPIC-ADD ON

## 2010-12-08 LAB — CBC
Hemoglobin: 9.2 g/dL — ABNORMAL LOW (ref 13.0–17.0)
RBC: 3.4 MIL/uL — ABNORMAL LOW (ref 4.22–5.81)

## 2010-12-08 NOTE — Op Note (Signed)
  [name], [name]              ACCOUNT NO.:  [id number]  MEDICAL RECORD NO.:  [id number]  LOCATION:  1511                         FACILITY:  [facility]  PHYSICIAN:  Sharlet Salina T. Devyn Sheerin, M.D.DATE OF BIRTH:  1980/08/20  DATE OF PROCEDURE:  12/03/2010 DATE OF DISCHARGE:                              OPERATIVE REPORT   TIME OF PROCEDURE:  15:40 p.m.  PREOPERATIVE DIAGNOSIS:  HIV/AIDS with questionable Kaposi sarcoma.  POSTOPERATIVE DIAGNOSIS:  HIV/AIDS with questionable Kaposi sarcoma.  PROCEDURE:  Punch biopsy of left lower extremity.  SURGEON:  Letha Cape, PAC working under [employer]. Tsion Inghram, M.D.  ASSISTANT:  None.  ANESTHESIA:  A 1% lidocaine with epinephrine local anesthesia.  COMPLICATIONS:  None.  ESTIMATED BLOOD LOSS:  Minimal.  SPECIMEN:  A 3-mm left lower extremity skin biopsy which is sent to pathology  DESCRIPTION OF PROCEDURE:  The patient was supine.  His left lower extremity on the shin area was prepped with Betadine.  I then used approximately 3 cc of 1% lidocaine with epinephrine to anesthetize the specified area.  I did chose an area with affected discoloration consistent with possible Kaposi sarcoma.  After this area was anesthetize, a 3 mm punch biopsy tool was used.  A pair of forceps and scissors was used to extract the specimen.  After this was extracted, it was placed in formalin.  Gauze was then used for hemostasis.  A dry 2 x 2 was then placed over the punch biopsy site.  This was then taped.  DISPOSITION:  The patient tolerated the procedure well.  He is currently lying in bed comfortably, in no acute distress.  We will begin daily dry dressing changes to this site or as needed for saturation.     Letha Cape, PA   ______________________________ Lorne Skeens. Dama Hedgepeth, M.D.    KEO/MEDQ  D:  12/03/2010  T:  12/03/2010  Job:  [id number]  cc:   Josph Macho, M.D. Fax: [phone]  Electronically Signed by Barnetta Chapel PA on  12/05/2010 03:48:52 PM Electronically Signed by Glenna Fellows M.D. on 12/08/2010 12:27:33 PM

## 2010-12-09 ENCOUNTER — Other Ambulatory Visit (HOSPITAL_COMMUNITY): Payer: Self-pay

## 2010-12-09 LAB — COMPREHENSIVE METABOLIC PANEL
ALT: 68 U/L — ABNORMAL HIGH (ref 0–53)
AST: 93 U/L — ABNORMAL HIGH (ref 0–37)
Alkaline Phosphatase: 136 U/L — ABNORMAL HIGH (ref 39–117)
CO2: 22 mEq/L (ref 19–32)
Chloride: 95 mEq/L — ABNORMAL LOW (ref 96–112)
Creatinine, Ser: 0.67 mg/dL (ref 0.50–1.35)
GFR calc non Af Amer: >60 mL/min (ref >60)
Potassium: 4.6 mEq/L (ref 3.5–5.1)
Total Bilirubin: 0.3 mg/dL (ref 0.3–1.2)

## 2010-12-09 LAB — CBC
MCV: 82.8 fL (ref 78.0–100.0)
Platelets: 304 10*3/uL (ref 150–400)
RBC: 3.9 MIL/uL — ABNORMAL LOW (ref 4.22–5.81)
WBC: 1.6 10*3/uL — ABNORMAL LOW (ref 4.0–10.5)

## 2010-12-09 NOTE — Consult Note (Signed)
  NAMEJOSEMANUEL, EAKINS              ACCOUNT NO.:  [id number]  MEDICAL RECORD NO.:  [id number]  LOCATION:  1511                         FACILITY:  [facility]  PHYSICIAN:  Eulogio Ditch, MD DATE OF BIRTH:  28-Mar-1981  DATE OF CONSULTATION:  12/01/2010 DATE OF DISCHARGE:                                CONSULTATION   REASON FOR CONSULTATION:  Depression.  HISTORY OF PRESENT ILLNESS:  The patient is a 30 year old male with history of HIV who was admitted on the medical floor because of altered mental status.  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  The patient reported he got sad about it but he denied any suicidal or homicidal ideations.  The patient reported poor sleep and appetite for the last few weeks but the patient denied hearing any voices.  He is not paranoid/delusional.  The patient reports he was diagnosed with HIV in 2003 for making some bad decisions.  His urine drug screen is negative and a head CT scan was done which was negative.  PAST PSYCH HISTORY:  The patient denied any past suicide attempt or any past hospitalization.  Not on any psych medications.  PAST MEDICAL HISTORY:  History of HIV.  ALLERGIES:  No known drug allergies.  SOCIAL HISTORY:  The patient is currently not working.  He is going to live with the mother/aunt.  The patient reported that the mother is very supportive.  SUBSTANCE ABUSE HISTORY:  The patient denies any illicit drug abuse or alcohol abuse.  MENTAL STATUS EXAM:  The patient is calm, cooperative during the interview.  Fair eye contact.  No psychomotor agitation or retardation present.  Speech is normal in rate, rhythm and volume.  Mood euthymic. Affect mood congruent.  Thought process logical and goal directed. Thought content, not suicidal or homicidal, not delusional.  Thought perception, no audiovisual hallucination reported, not internally preoccupied.   Cognition, alert, awake, oriented x3.  Memory, immediate, recent remote fair.  Attention and concentration fair.  Abstraction ability good.  Insight and judgment fair.  DIAGNOSIS:  AXIS I:  Adjustment disorder, depressed type, rule out major depressive disorder. AXIS II:  Deferred. AXIS III:  See medical notes. AXIS IV:  Not working and recently lost job. AXIS V:  50.  RECOMMENDATIONS: 1. The patient agreed for outpatient counseling.  At this time I will     not start any medication. 2. The patient can be discharged to follow up in the outpatient     counseling at this time. 3. Psychoeducation given to the patient.  Thanks for involving me in taking care of this patient.     Eulogio Ditch, MD     SA/MEDQ  D:  12/01/2010  T:  12/01/2010  Job:  [id number]  Electronically Signed by Eulogio Ditch  on 12/09/2010 09:54:25 AM

## 2010-12-10 LAB — DIFFERENTIAL
Basophils Absolute: 0 10*3/uL (ref 0.0–0.1)
Eosinophils Absolute: 0 10*3/uL (ref 0.0–0.7)
Lymphocytes Relative: 0 % — ABNORMAL LOW (ref 12–46)
Monocytes Absolute: 0 10*3/uL — ABNORMAL LOW (ref 0.1–1.0)
Monocytes Relative: 0 % — ABNORMAL LOW (ref 3–12)
nRBC: 0 /100 WBC

## 2010-12-10 LAB — COMPREHENSIVE METABOLIC PANEL
CO2: 23 mEq/L (ref 19–32)
Calcium: 8.8 mg/dL (ref 8.4–10.5)
Chloride: 93 mEq/L — ABNORMAL LOW (ref 96–112)
Creatinine, Ser: 0.63 mg/dL (ref 0.50–1.35)
GFR calc Af Amer: >60 mL/min (ref >60)
GFR calc non Af Amer: >60 mL/min (ref >60)
Glucose, Bld: 99 mg/dL (ref 70–99)
Total Bilirubin: 0.2 mg/dL — ABNORMAL LOW (ref 0.3–1.2)

## 2010-12-10 LAB — CBC
HCT: 30.1 % — ABNORMAL LOW (ref 39.0–52.0)
Hemoglobin: 9.8 g/dL — ABNORMAL LOW (ref 13.0–17.0)
MCH: 26.5 pg (ref 26.0–34.0)
MCV: 81.4 fL (ref 78.0–100.0)
RBC: 3.7 MIL/uL — ABNORMAL LOW (ref 4.22–5.81)

## 2010-12-11 LAB — DIFFERENTIAL
Band Neutrophils: 0 % (ref 0–10)
Blasts: 0 %
Eosinophils Absolute: 0 10*3/uL (ref 0.0–0.7)
Lymphocytes Relative: 56 % — ABNORMAL HIGH (ref 12–46)
Monocytes Absolute: 0 10*3/uL — ABNORMAL LOW (ref 0.1–1.0)
Myelocytes: 0 %
Neutrophils Relative %: 41 % — ABNORMAL LOW (ref 43–77)
Promyelocytes Absolute: 0 %

## 2010-12-11 LAB — COMPREHENSIVE METABOLIC PANEL
ALT: 58 U/L — ABNORMAL HIGH (ref 0–53)
AST: 43 U/L — ABNORMAL HIGH (ref 0–37)
Alkaline Phosphatase: 182 U/L — ABNORMAL HIGH (ref 39–117)
CO2: 23 mEq/L (ref 19–32)
Chloride: 90 mEq/L — ABNORMAL LOW (ref 96–112)
GFR calc Af Amer: >60 mL/min (ref >60)
GFR calc non Af Amer: >60 mL/min (ref >60)
Glucose, Bld: 97 mg/dL (ref 70–99)
Potassium: 4.1 mEq/L (ref 3.5–5.1)
Sodium: 121 mEq/L — ABNORMAL LOW (ref 135–145)
Total Bilirubin: 0.2 mg/dL — ABNORMAL LOW (ref 0.3–1.2)

## 2010-12-11 LAB — PATHOLOGIST SMEAR REVIEW

## 2010-12-11 LAB — CULTURE, BLOOD (ROUTINE X 2)
Culture  Setup Time: 201207202140
Culture: NO GROWTH

## 2010-12-11 LAB — CBC
Hemoglobin: 9.1 g/dL — ABNORMAL LOW (ref 13.0–17.0)
MCH: 26.1 pg (ref 26.0–34.0)
Platelets: ADEQUATE 10*3/uL (ref 150–400)
RBC: 3.48 MIL/uL — ABNORMAL LOW (ref 4.22–5.81)
WBC: 0.8 10*3/uL — CL (ref 4.0–10.5)

## 2010-12-11 LAB — VANCOMYCIN, TROUGH: Vancomycin Tr: 10.3 ug/mL (ref 10.0–20.0)

## 2010-12-11 LAB — NA AND K (SODIUM & POTASSIUM), RAND UR: Potassium Urine: 27 mEq/L

## 2010-12-12 DIAGNOSIS — B20 Human immunodeficiency virus [HIV] disease: Secondary | ICD-10-CM

## 2010-12-12 LAB — COMPREHENSIVE METABOLIC PANEL
AST: 40 U/L — ABNORMAL HIGH (ref 0–37)
Albumin: 1.9 g/dL — ABNORMAL LOW (ref 3.5–5.2)
Alkaline Phosphatase: 191 U/L — ABNORMAL HIGH (ref 39–117)
BUN: 9 mg/dL (ref 6–23)
Creatinine, Ser: 0.71 mg/dL (ref 0.50–1.35)
Potassium: 4.3 mEq/L (ref 3.5–5.1)
Total Protein: 6.7 g/dL (ref 6.0–8.3)

## 2010-12-12 LAB — OSMOLALITY, URINE: Osmolality, Ur: 416 mOsm/kg (ref 390–1090)

## 2010-12-12 LAB — SODIUM, URINE, RANDOM: Sodium, Ur: 87 mEq/L

## 2010-12-13 LAB — COMPREHENSIVE METABOLIC PANEL
ALT: 54 U/L — ABNORMAL HIGH (ref 0–53)
AST: 39 U/L — ABNORMAL HIGH (ref 0–37)
CO2: 26 mEq/L (ref 19–32)
Chloride: 91 mEq/L — ABNORMAL LOW (ref 96–112)
GFR calc non Af Amer: >60 mL/min (ref >60)
Glucose, Bld: 104 mg/dL — ABNORMAL HIGH (ref 70–99)
Sodium: 125 mEq/L — ABNORMAL LOW (ref 135–145)
Total Bilirubin: 0.3 mg/dL (ref 0.3–1.2)

## 2010-12-14 LAB — COMPREHENSIVE METABOLIC PANEL
Albumin: 2.3 g/dL — ABNORMAL LOW (ref 3.5–5.2)
BUN: 18 mg/dL (ref 6–23)
Calcium: 9.4 mg/dL (ref 8.4–10.5)
Creatinine, Ser: 0.85 mg/dL (ref 0.50–1.35)
Total Bilirubin: 0.4 mg/dL (ref 0.3–1.2)
Total Protein: 7.2 g/dL (ref 6.0–8.3)

## 2010-12-14 LAB — PHOSPHORUS: Phosphorus: 4.1 mg/dL (ref 2.3–4.6)

## 2010-12-15 LAB — COMPREHENSIVE METABOLIC PANEL
ALT: 42 U/L (ref 0–53)
AST: 26 U/L (ref 0–37)
CO2: 28 mEq/L (ref 19–32)
Calcium: 9.6 mg/dL (ref 8.4–10.5)
Chloride: 91 mEq/L — ABNORMAL LOW (ref 96–112)
GFR calc non Af Amer: >60 mL/min (ref >60)
Sodium: 128 mEq/L — ABNORMAL LOW (ref 135–145)

## 2010-12-16 ENCOUNTER — Ambulatory Visit: Payer: Self-pay | Admitting: Hematology & Oncology

## 2010-12-16 DIAGNOSIS — B20 Human immunodeficiency virus [HIV] disease: Secondary | ICD-10-CM

## 2010-12-16 LAB — CBC
HCT: 31.7 % — ABNORMAL LOW (ref 39.0–52.0)
Hemoglobin: 10.3 g/dL — ABNORMAL LOW (ref 13.0–17.0)
MCV: 81.5 fL (ref 78.0–100.0)
RBC: 3.89 MIL/uL — ABNORMAL LOW (ref 4.22–5.81)
WBC: 0.8 10*3/uL — CL (ref 4.0–10.5)

## 2010-12-16 LAB — COMPREHENSIVE METABOLIC PANEL
BUN: 18 mg/dL (ref 6–23)
CO2: 28 mEq/L (ref 19–32)
Chloride: 91 mEq/L — ABNORMAL LOW (ref 96–112)
Creatinine, Ser: 0.93 mg/dL (ref 0.50–1.35)
GFR calc non Af Amer: >60 mL/min (ref >60)
Glucose, Bld: 102 mg/dL — ABNORMAL HIGH (ref 70–99)
Total Bilirubin: 0.4 mg/dL (ref 0.3–1.2)

## 2010-12-16 LAB — DIFFERENTIAL
Band Neutrophils: 0 % (ref 0–10)
Basophils Relative: 0 % (ref 0–1)
Lymphocytes Relative: 0 % — ABNORMAL LOW (ref 12–46)
Monocytes Absolute: 0 10*3/uL — ABNORMAL LOW (ref 0.1–1.0)
Monocytes Relative: 0 % — ABNORMAL LOW (ref 3–12)

## 2010-12-16 LAB — PHOSPHORUS: Phosphorus: 4.4 mg/dL (ref 2.3–4.6)

## 2010-12-17 LAB — COMPREHENSIVE METABOLIC PANEL
ALT: 34 U/L (ref 0–53)
AST: 25 U/L (ref 0–37)
Calcium: 9.8 mg/dL (ref 8.4–10.5)
Creatinine, Ser: 0.92 mg/dL (ref 0.50–1.35)
GFR calc Af Amer: >60 mL/min (ref >60)
Sodium: 129 mEq/L — ABNORMAL LOW (ref 135–145)
Total Protein: 7.2 g/dL (ref 6.0–8.3)

## 2010-12-17 NOTE — Progress Notes (Signed)
[name], [name] NO.:  [id number]  MEDICAL RECORD NO.:  [id number]  LOCATION:  1511                         FACILITY:  [facility]  PHYSICIAN:  Ramiro Harvest, MD    DATE OF BIRTH:  May 17, 1981                                PROGRESS NOTE   This is a progress note covering the events from December 03, 2010, through December 09, 2010.  CURRENT DIAGNOSES: 1. Hyponatremia. 2. Acquired immunodeficiency syndrome/human immunodeficiency virus.     CD-4 count of 10. 3. Transaminitis likely secondary to HAART therapy. 4. Severe iron-deficiency anemia. 5. Pneumonia status post 7 days of antibiotic therapy. 6. Kaposi sarcoma. 7. Mid esophageal ulcer. 8. Acute encephalopathy secondary to acquired immunodeficiency     syndrome, resolved. 9. Depression. 10.Cytomegalovirus. 11.Neutropenia likely secondary to Valcyte. 12.Sinus tachycardia likely secondary to acute illness.  CURRENT MEDICATIONS: 1. Tessalon Perles 200 mg p.o. t.i.d. 2. Darunavir 800 mg p.o. daily. 3. Truvada 1 tablet p.o. daily. 4. Diflucan 100 mg p.o. daily. 5. Lopressor 25 mg p.o. b.i.d. 6. Protonix 40 mg IV q.12. 7. Norvir 100 mg p.o. daily. 8. Carafate 1 g p.o. q.i.d. 9. Bactrim 1 tablet p.o. daily. 10.Valcyte 900 mg p.o. b.i.d.  CONSULTANTS ON THE CASE: Dr. Ninetta Lights of Infectious Diseases, Dr. Myna Hidalgo of Oncology, and Dr. Johna Sheriff of [facility] surgery.  Dr. Charlott Rakes of [facility].  PROCEDURES PERFORMED: Procedures performed during this time period:  Colonoscopy was done by Dr. Charlott Rakes of [facility] on December 03, 2010, that showed scattered areas of ulcerated mucosa concerning for Kaposi sarcoma status post biopsies.  An upper endoscopy was done by Dr. Bosie Clos on December 03, 2010, that showed a mid esophageal ulcer status post cytologic brushing to check for herpes versus Kaposi sarcoma, mild gastritis.  A skin punch biopsy was done by Dr. Johna Sheriff on December 03, 2010, with a punch  biopsy of the left lower extremity that was positive for Kaposi sarcoma.  A nuclear medicine cardiac MUGA scan was done on December 04, 2010, that showed resting left ventricular EF of 64%.  A CT-guided core biopsy and aspirate of the right iliac bone marrow was done on December 05, 2010, by Interventional Radiology.  HOSPITAL COURSE: 1. HIV/AIDS.  The patient was diagnosed during this hospitalization     with HIV/AIDS.  He was initially placed on Atripla as well as     Diflucan and Bactrim.  The patient was tolerating these medications     initially.  ID was following.  HIV genotype was pending at this     time.  The patient subsequently continued to spike fevers and     experience rigors during the night.  It was felt this was likely     secondary to Atripla.  ID was made aware of this.  His medications     have been changed.  Atripla was discontinued.  He has been put on a     different HAART therapy regimen per ID and his fevers have     improved.  His rigors have resolved, however, his LFTs are starting     to trend up.  We will need to monitor this and follow  up with ID in     terms of his transaminitis.  The patient medications have been     adjusted, being followed and monitored per ID.  Final HAART therapy     will be dictated per Infectious Disease doctor. 2. Kaposi sarcoma.  During the hospitalization, there was a concern     for Kaposi sarcoma.  The patient was seen by Dr. Myna Hidalgo of     Oncology during the hospitalization.  An upper endoscopy as well as     colonoscopy was done per Dr. Bosie Clos and biopsies were done.     Colonoscopy results, mucosa was consistent with Kaposi sarcoma.     Biopsies were also taken and pending upper endoscopy did show a mid     esophageal ulcer.  It was recommended that the patient be on PPI IV     q.12 h for 5 days and subsequently changed to oral once daily.  Dr.     Myna Hidalgo enlisted the help of [facility] and the     patient was  seen by Dr. Johna Sheriff.  The patient did undergo a punch     biopsy which was positive for Kaposi sarcoma.  Oncology has been     following and it was felt that the patient may benefit from     systemic chemo from his Kaposi sarcoma.  It was recommended that     Doxil was probably the most active drug to be done.  A MUGA scan     was done on December 04, 2010, that showed a normal EF of 64%.  It was     recommended per Dr. Myna Hidalgo that systemic chemo be started as an     outpatient once the patient gets to [place], [place],     which is where the patient will be going to stay with his family     out there and this will need to be coordinated with Dr. Myna Hidalgo in     terms of which oncologist the patient is to see for induction of     this chemo for his Kaposi sarcoma. 3. Hyponatremia.  During the hospitalization, the patient was noted to     be hyponatremic.  The patient was initially placed on fluids and     followed.  However, his sodium levels continued to slowly trend     down.  IV fluids were saline locked.  The patient was placed on     fluid restriction at 1200 cc daily.  This seemed as if his sodium     levels had plateaued.  It was felt this could possibly be secondary     to HIV/AIDS, however, on December 08, 2010, and December 09, 2010, the     patient's sodium levels continued to drift down such that as of     December 09, 2010, his sodium was down to 125.  The patient was     asymptomatic.  The patient did not have any polydipsia.  A urine     osmolarity and a serum osmolality is currently pending.  A     fractional excretion of sodium is also pending.  Urine sodium and     urine creatinine are also pending for further evaluation.  If the     patient's sodium levels continued to decrease with no clear     etiology, may consider consulting Renal for help for further     evaluation of his hyponatremia.  The  patient is currently in stable     condition and is seizure free. 4. Severe  iron-deficiency anemia likely secondary to HIV AIDS.  The     patient was noted to be anemic, however, was not having any active     GI bleed.  The patient did have an upper endoscopy done that showed     a mid esophageal ulcer.  It was recommended that the patient remain     on a Protonix 40 mg IV q.12 h for 5 days and then be switched to 40     mg orally daily there afterwards.  Colonoscopy which was also done     did not show any bleeding ulcers.  The patient's hemoglobin has     remained stable and may benefit from IV iron which can be done as     an outpatient at some point.  The patient is currently in stable     condition. 5. Pneumonia.  During the hospitalization, the patient started to have     a productive cough.  Chest x-ray which was obtained was consistent     with a right bronchopneumonia.  The patient was placed on IV     azithromycin and Rocephin and the patient completed a 1-week course     of antibiotic therapy. 6. Esophageal ulcer.  During the workup of the patient's iron-     deficiency anemia as well as workup for Kaposi sarcoma, an upper     endoscopy was done by Dr. Bosie Clos of [facility].  The patient was     noted to have a mid esophageal ulcer.  Brushings were done to rule     out Kaposi.  The patient was also noted to have mild gastritis.     The patient was being placed on proton pump inhibitor IV twice     daily for 5 days and this can be subsequently changed to 40 mg     orally once daily thereafter.  The patient has not had any     hematemesis.  His hemoglobin has remained stable and he is     currently in stable condition. 7. CMV.  As endoscopy was done, CMV results which were obtained came     back elevated.  The patient has been started on Valcyte per ID as a     question as to CMV brushings was noted on the biopsy done on colon     biopsy, did show inflammation and features of CMV infection.  The     patient has been maintained on Valcyte during this  hospitalization     and being followed per ID. 8. Neutropenia.  The patient is noted to be neutropenic/pancytopenic.     Bone marrow biopsy was obtained.  The patient's worsening     neutropenia could be felt secondary to his Valcyte.  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     Immunochemistry which was performed was negative for herpes and     negative for CMV.  The patient's counts have remained stable and     will follow up with Oncology as a outpatient. 9. Sinus tachycardia.  During the hospitalization, the patient was     noted to have a sinus tachycardia.  Cardiac enzymes which were     cycled were negative.  MUGA scan which was done did have a normal     ejection fraction.  The patient remained asymptomatic.  It was felt     this was likely secondary to his acute illness.  The patient was     hydrated with IV fluids and was euvolemic.  The patient has been     placed on beta-blocker with improvement of his sinus tachycardia.     The patient is currently in stable condition. 10.Transaminitis.  During this last 2 days, the patient's LFTs have     noted to increase.  It was felt this was secondary to the HAART     therapy as this was recently changed.  The patient is currently     asymptomatic and in stable condition.  This will need to be     followed up upon with HAART therapy, need to be followed up upon     per infectious diseases.  The rest of the patient's chronic issues have remained stable.  For the rest of the hospitalization, please see prior progress note dictated by Dr. Mahala Menghini of job number [id number].  It has been a pleasure taking care of Mr. Kennie Snedden.     Ramiro Harvest, MD     DT/MEDQ  D:  12/09/2010  T:  12/09/2010  Job:  [id number]  Electronically Signed by Ramiro Harvest MD on 12/17/2010 08:15:02 PM

## 2010-12-18 LAB — COMPREHENSIVE METABOLIC PANEL
AST: 25 U/L (ref 0–37)
Alkaline Phosphatase: 148 U/L — ABNORMAL HIGH (ref 39–117)
BUN: 15 mg/dL (ref 6–23)
CO2: 30 mEq/L (ref 19–32)
Chloride: 90 mEq/L — ABNORMAL LOW (ref 96–112)
Creatinine, Ser: 0.87 mg/dL (ref 0.50–1.35)
GFR calc non Af Amer: >60 mL/min (ref >60)
Potassium: 3.5 mEq/L (ref 3.5–5.1)
Total Bilirubin: 0.2 mg/dL — ABNORMAL LOW (ref 0.3–1.2)

## 2010-12-18 LAB — CULTURE, BLOOD (ROUTINE X 2)
Culture  Setup Time: 201207270349
Culture: NO GROWTH

## 2010-12-19 LAB — COMPREHENSIVE METABOLIC PANEL
ALT: 30 U/L (ref 0–53)
CO2: 26 mEq/L (ref 19–32)
Calcium: 9 mg/dL (ref 8.4–10.5)
Creatinine, Ser: 0.78 mg/dL (ref 0.50–1.35)
GFR calc Af Amer: >60 mL/min (ref >60)
GFR calc non Af Amer: >60 mL/min (ref >60)
Glucose, Bld: 121 mg/dL — ABNORMAL HIGH (ref 70–99)
Sodium: 130 mEq/L — ABNORMAL LOW (ref 135–145)
Total Protein: 6.5 g/dL (ref 6.0–8.3)

## 2010-12-19 LAB — MAGNESIUM: Magnesium: 1.9 mg/dL (ref 1.5–2.5)

## 2010-12-20 NOTE — Discharge Summary (Signed)
NAMEJOSEARMANDO, [name]              ACCOUNT NO.:  [id number]  MEDICAL RECORD NO.:  [id number]  LOCATION:  1511                         FACILITY:  [facility]  PHYSICIAN:  Marinda Elk, M.D.DATE OF BIRTH:  07-27-1980  DATE OF ADMISSION:  11/28/2010 DATE OF DISCHARGE:  12/19/2010                              DISCHARGE SUMMARY   DISCHARGE DIAGNOSES: 1. 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 2. Acquired immunodeficiency syndrome. 3. Febrile neutropenia. 4. Kaposi's sarcoma.  DISCHARGE MEDICATIONS: 1. Tylenol 325 mg q.4 h p.r.n. 2. Truvada 1 tablet daily. 3. Fluconazole 100 mg daily for 21 days. 4. Lasix 60 mg b.i.d. 5. Metoprolol 25 mg b.i.d. 6. Protonix 40 mg b.i.d. 7. Isentress 400 mg b.i.d. 8. Sucralfate 1 gram four times a day. 9. Bactrim double-strength 1 tablet every other daily. 10.Valcyte 900 mg b.i.d. with meals.  IMAGING:  CT biopsy of the bone marrow, right posterior iliac bone with aspiration, that showed bone marrow appears normocellular with granulocytic hypoplasia with a relative increase in his erythroid precursors, there is slight increase in iron stores.  No HSV8 or CMV identified.  MUGA, that showed an EF of 64%.  Chest x-ray, that showed findings suggestive number of bronchitis and right-sided bronchopneumonia.  CT scan of the  neck that showed chronic sphenoid and maxillary sinusitis, small right mastoid effusions.  CT chest that showed patchy ground-glass opacity, classic mosaic attenuation favoring at the lung base with some fade-associated nodularity.  Appearance does not have the peribronchovascular nodularity and septal thickening classic for sarcoma.  CT scan of the abdomen and pelvis that showed subcutaneous edema along the groin.  No distal lymphadenopathy. Nonspecific hypodense lesion in L4 body.  Brain MRI that showed background pattern of brain atrophy, restricted diffusion and perhaps low-level  enhancement of the epididymal region of the lateral ventricles, right more than left.  CT head that showed no evidence of acute intracranial masses.  BRIEF ADMITTING HISTORY AND PHYSICAL:  Please refer to dictation from November 28, 2010 for further details.  This is a 30 year old male with past medical history of AIDS, who apparently is brought to the ED for altered mental status.  Apparently, he was seen out of his house late in the evening and behaving not like himself.  His mother who lives away was contacted and got him to the hospital.  She confirmed that there was a major personality change, he had conversed in the last 2-3 days ago and at that time, he was apparently normal.  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  He worked in the [employer] at [employer].  He is alert and oriented and conversant, however, he has a labeled defensive attitude.  He denies any headache, nausea, vomiting, cramps, or visual problems.  He did complain of some rash in his buttock, which was previously painful, but not crusted.  Evaluation in the ED shows white count of 5500, but his hemoglobin is 7.3.  He had no black stools.  His urinalysis is negative and his urine sodium is 129.  His potassium is 3.0, so we were asked  to admit and further evaluate.  CONSULTANTS: 1. [facility], Casimiro Needle, M.D. 2. ID, Dr. Ninetta Lights. 3. GI, Charlott Rakes. 4. Psychiatry, Dr. Eulogio Ditch, MD. 5. Dr. Arlan Organ, Oncology.  LABS ON ADMISSION:  Showed a CD-4 count of 10.  His UDS was negative. His FOBT was positive.  Alcohol level was less than 11.  His sodium was 129, potassium 3.0, chloride 84, bicarb of 35, glucose of 116, BUN of 15, creatinine of 1.1, calcium of 9.7.  His white count was 5.5 with hemoglobin of 7.7 with an ANC of 3.19, MCV of 81, and platelet count of 398.  His UA shows no signs of infection.  His lactic acid was 1.4.   His CK was 786.  His LP showed a glucose of 54, protein of 32.  Gram stain with no organisms.  Cell count on Differential just showed 2 white blood cells and 242 red blood cells.  His absolute retic count was 40.  His hepatic function panel was within normal limits except for his AST which was 41.  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  The patient developed     hypernatremia with a sodium of 121, fatigue and decreased     mentation.  The patient was put on fluid restriction, however, his     sodium did respond slow.  He was started on Lasix in the addition     with fluid restriction.  Nephrology was consulted, they went to get     several cause with no clear answer.  Urine osmolarity reflect     SIADH.  His cortisol level was negative.  His TSH was normal.  With     this in mind, the only indication for his medication that will     cause SIADH, will be ritonavir.  Ultimately, Dr. Hyman Hopes recommended     that the ritonavir be stopped and restricted fluid.  On the day of     discharge, he will go home on a liter fluid restriction, Lasix     twice a day.  On the day of discharge, his sodium was 130. 2. Febrile neutropenia.  The patient developed a fever on the 27th.     Vancomycin and cefepime was started.  He was changed over to     Augmentin and Levaquin.  He completed a course in the hospital.  It     was consulted with ID, who recommended to stop the antibiotic after     a 7-day course.  He did not spike any further fevers during his     hospital stay. 3. HIV/AIDS.  The patient was placed on Atripla, Diflucan, and     Bactrim.  His ritonavir was stopped.  ID was consulted, genotype     was done.  His LFTs were followed up.  ID recommended to follow on     his current treatment and follow up with his Infectious Disease     doctor over at [place].  ID recommended to go on Bactrim, and     also continue the Valcyte, although this is causing leukopenia     secondary  to his severe esophagitis and colitis. 4. Kaposi's sarcoma.  The patient was seen by Dr. Myna Hidalgo.  Upper     endoscopy was done by Dr. Bosie Clos with biopsies and done with     results to show Kaposi's sarcoma.  It was recommended that the     patient will start on a PPI, continue  this twice a day, Dr. Myna Hidalgo     called [facility] and the patient was seen with Dr.     Johna Sheriff.  The patient did undergo a punch biopsy which was     positive for sarcoma, oncology was consulted, felt that he may     benefit from systemic chemotherapy.  It was recommended Doxil was     probably the most effective drug.  A MUGA was done, which showed an     EF of 64% and Dr. Myna Hidalgo recommended to start this chemotherapy at     [facility] as an outpatient. 5. Pneumonia.  Initially, he was started on vanc and cefepime, then     this was changed to Augmentin and Levaquin.  He completed 7-day     course in the antibiotics. 6. CMV esophagitis.  An endoscopy was done with biopsy and brushing     showing CMV.  Although, Valcyte was causing leukopenia due to     severe esophagitis, ID recommended to continue Valcyte and follow     up as an outpatient. 7. Neutropenia.  It is probably secondary to Valcyte.  Due to severe     esophagitis, ID recommended to continue to follow this and continue     Diflucan for 100 days and they have called ID over at [place] to     followup on the patient.  On the day of discharge, his temperature is 99, pulse of 98, respirations of 20, blood pressure of 116/63.  He was satting 100% on room air.  Labs on day of discharge shows a mag of 1.9.  His sodium was 130, potassium 3.5, chloride 95, glucose of 121, BUN of 12, creatinine 0.7. LFTs within normal limits except for his alkaline phosphatase which is 127.  DISPOSITION:  The patient will follow up with his ID doctor over at [place].  He will check a CBC to follow up on his white count and  his hemoglobin.  We will also get a BMET to follow up on his sodium and titrate Lasix as needed for his hyponatremia.     Marinda Elk, M.D.     AF/MEDQ  D:  12/19/2010  T:  12/20/2010  Job:  [id number]  cc:   [facility]  Electronically Signed by Marinda Elk M.D. on 12/20/2010 08:07:51 PM

## 2010-12-22 NOTE — Consult Note (Signed)
NAMESHAMELL, [name]              ACCOUNT NO.:  [id number]  MEDICAL RECORD NO.:  [id number]  LOCATION:  1511                         FACILITY:  [facility]  PHYSICIAN:  Mindi Slicker. Lowell Guitar, M.D.  DATE OF BIRTH:  [birth year]  DATE OF CONSULTATION: DATE OF DISCHARGE:                                CONSULTATION   I was asked by Dr. Ashley Royalty to see this 30 year old male who has AIDS, Kaposi sarcoma, febrile neutropenia, had evidence of encephalopathy upon presentation who developed hyponatremia during this hospitalization.  We are asked to see the patient today because his serum sodium was 121 mg/dL.  Of note, the patient's serum sodium was 136 mEq/L on November 30, 2010.  Of note during the course of this hospitalization, the patient has had gastrointestinal bleed secondary to an esophageal ulcer presumably on the basis of cytomegalovirus and history of right lung pneumonia and has developed transaminitis, depression.  In addition, the patient developed neutropenia and had bone marrow evaluation.  PAST HISTORY:  Remarkable for history of HIV that is untreated, AIDS, Kaposi sarcoma, anemia, depression, thrush.  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  Do not smoke or consume alcoholic beverages.  FAMILY HISTORY:  Positive for diabetes.  REVIEW OF SYSTEMS:  The patient denies any significant polyuria or polydipsia.  MEDICATIONS:  Current inpatient medications include Tessalon, Augmentin, Prezista, tenofovir, Diflucan, Lopressor, Protonix, Norvir, Carafate, Bactrim, Valtrex.  PHYSICAL EXAMINATION:  GENERAL:  Thin African American male, input versus output 990 cc/ urine output not recorded. VITAL SIGNS:  Weight was 75.5 pounds on December 11, 2010, weight was 76 kilos on November 28, 2010.  The patient is febrile today, temperature is 100 degrees, blood pressure supine was 114/59, standing 110/62, heart rate supine was 96, and standing was 135. HEENT:  Atraumatic, normocephalic.   There was temporal wasting present. There was hyperpigmentation on nose.  Alert, appropriate, oriented in all spheres. ABDOMEN:  Soft but distended. EXTREMITIES:  Moves all extremities.  Lower extremity edema with scaliness, hyperpigmentation with chronic changes present.  There is 1+ pitting edema.  LABORATORY DATA:  Urine sodium 152, urine osmolality 745 mOsm/kg, white blood count 800, hemoglobin 9.1 g, hematocrit 28%, platelets are clumped and decreased.  Sodium 121, potassium 4.1, chloride 23, chloride 97, BUN 9, creatinine 0.65, albumin 1.7, SGOT 23, SGPT 53.  ASSESSMENT:  Hyponatremia.  The patient's urine sodium osmolality is consistent with SIADH.  The patient, however, has evidence of lower extremity edema.  This is most likely due to the low serum albumin.  I do suspect that the patient is essentially euvolemic given the low serum albumin most likely the cause of his peripheral edema which has been long-standing.  The cause of this hyponatremia due to SIADH is potentially multiple origins including Kaposi sarcoma, antiretroviral medications or discomfort.  RECOMMENDATIONS:  This is a difficult problem for aggressive fluid restriction.  The polypharmacy and the malignancy make identifying the culprit difficult.  We will recheck urine osmolality and urine sodium.          ______________________________ Mindi Slicker Lowell Guitar, M.D.     ACP/MEDQ  D:  12/11/2010  T:  12/11/2010  Job:  [id number]  Electronically  Signed by Casimiro Needle M.D. on 12/22/2010 06:32:56 PM

## 2010-12-30 LAB — FUNGUS CULTURE W SMEAR

## 2010-12-30 NOTE — Progress Notes (Signed)
[name], [name]              ACCOUNT NO.:  [id number]  MEDICAL RECORD NO.:  [id number]  LOCATION:  1511                         FACILITY:  [facility]  PHYSICIAN:  Altha Harm, MDDATE OF BIRTH:  1980/11/06                                PROGRESS NOTE   CURRENT ACTIVE CONDITION IS BEING TREATED DURING THIS TIME: 1. SIADH 2. Hyponatremia. 3. AIDS. 4. Neutropenia. 5. Kaposi sarcoma. 6. Febrile neutropenia, fever now resolved.  DISCHARGE MEDICATIONS: To be dictated by the discharging physician at time of discharge.  CONSULTANTS: Dr. Lowell Guitar and accompanying nephrology.  Continue consultation by Infectious Diseases, Lacretia Leigh. Ninetta Lights, MD.  PROCEDURES: None.  DIAGNOSTIC STUDIES: No further diagnostic studies during this time.Marland Kitchen  PHYSICAL EXAMINATION: GENERAL:  The patient states that he is feeling much better today and yesterday over the last few days.  He is somewhat energetic.  He is able to stand without any dizziness. VITAL SIGNS:  Temperature is 98.5, heart rate 91, blood pressure 106/70, respiratory rate 17, O2 saturations are 99% on room air. HEENT:  He is normocephalic, atraumatic.  Pupils are equally round and reactive to light and accommodation.  Extraocular movements are intact. Oropharynx is moist.  No exudate, erythema, or lesions are noted. NECK:  Trachea is midline.  No masses.  No thyromegaly.  No JVD.  No carotid bruit. RESPIRATORY:  The patient has a normal respiratory effort, equal excursion bilaterally.  No wheezing or rhonchi noted. CARDIOVASCULAR:  Normal S1 and S2.  No murmurs, rubs, or gallops are noted. ABDOMEN:  Obese, soft, nontender, nondistended.  No masses.  No hepatosplenomegaly. EXTREMITIES:  Show no clubbing, cyanosis, or edema.  ASSESSMENT/PLAN: 1. 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  The patient developed hyponatremia with a sodium     down to 121 with symptoms of fatigue,  decrease mentation.  The     patient was put on fluid restriction, however, his sodiums were     slow to respond.  He was started on Lasix in addition to the fluid     restriction.  Nephrology was consulted.  They investigated several     things including a cortisol looking for adrenal insufficiency.     Urine osmolality reflected syndrome of inappropriate secretion of     antidiuretic hormone and the cortisol was found to be negative.     The patient's thyroid was supplemented as reflected by a normal     TSH.  With this in mind, the only indicting medication was     ritonavir which the patient had been placed on by IV.  Ultimately,     Dr. Hyman Hopes recommended that the ritonavir be stopped and the patient     continue on his fluid restriction which is now down to 600 mL a day     in addition to the Lasix.  The patient has had an improvement in     his sodium up to 128 and he has improved his energy and is feeling     much better.  I do believe that the patient's will be probably     discharge with sodium of 128.  The confounding  factor, however, is     that the patient has been on a very restricted fluid of only 600     mL, for which he will not be able to maintain.  Thus, there needs     to be discussion with the Medicine and Nephrology as to how we will     liberalize this patient's fluid intake and whether or not salt     tablets will be added to allow for a maintained sodium in the     blood.  Once this has been worked out, then the patient can likely     be discharged.  The discharging physician of this patient will be     going with his mother to the [place] part [place] the state close to     [place] and Dr. Ninetta Lights will try to give some recommendations on     contacts due to the patient will get a contact and to continue his     care. 2. The patient has finding illness with Kaposi sarcoma.  Several     medications have been tried and currently the patient is on a     regimen of  Truvada,  Raltegravir, Bactrim, Ganciclovir.  ID is to     give recommendations on his continued regimen. 3. Febrile neutropenia.  The patient developed some febrile     neutropenia on the 27th and was started on vancomycin and cefepime.     Once the patient became afebrile, his regimen was changed over to     Augmentin and Levaquin.  The patient is currently on day #6 of     Augmentin and Levaquin.  I would confer with ID to see when we can     stop his antibiotics.  CBCs ordered for tomorrow to evaluate     whether or not we can stop the Augmentin based on his neutropenia.     Particularly since the source of the fever was not found.  This brings [place] up-to-date on the patient's clinical course for the last 7 days.     Altha Harm, MD     MAM/MEDQ  D:  12/16/2010  T:  12/16/2010  Job:  [id number]  Electronically Signed by Marthann Schiller MD on 12/30/2010 08:54:24 PM

## 2011-01-06 NOTE — Op Note (Signed)
  NAMEQUINLIN, CONANT              ACCOUNT NO.:  [id number]  MEDICAL RECORD NO.:  [id number]  LOCATION:  1511                         FACILITY:  [facility]  PHYSICIAN:  Shirley Friar, MDDATE OF BIRTH:  08-31-1980  DATE OF PROCEDURE:  12/03/2010 DATE OF DISCHARGE:                              OPERATIVE REPORT   PROCEDURE:  Colonoscopy.  INDICATIONS:  Anemia, heme-positive stool, HIV/AIDS, and skin manifestations of Kaposi sarcoma, undergoing a colonoscopy to look for any GI manifestations of Kaposi sarcoma.  MEDICATIONS:  Fentanyl 75 mcg IV, Versed 5 mg IV.  Additional medicine given for preceding upper endoscopy.  FINDINGS:  Pediatric colonoscope was inserted into a well-prepped colon and on insertion there were scattered raised erythematous nodules in the descending colon.  The colonoscope was advanced to the cecum where ileocecal valve and appendiceal orifice were identified.  The ileocecal valve was ulcerated, edematous and erythematous.  The terminal ileum was intubated which revealed edematous mucosa that was biopsied.  Excessive looping prevented passage of the colonoscope proximal to 10 cm into the terminal ileum.  No ulceration or erythema was seen in the examined terminal ileum.  Biopsies were taken of the terminal ileum for histologic purposes.  Biopsies were taken of the ileocecal valve for histologic purposes.  On further withdrawal of the colonoscope, biopsies were taken of the nodular areas in the descending colon.  On withdrawal back into the rectum, retroflexion revealed a rigid mucosa in the rectum with ulcer and erythema.  This was biopsied and was friable.  ASSESSMENT:  Scattered areas of ulcerated mucosa concerning for Kaposi's sarcoma status post biopsies.  PLAN: 1. Follow up on path. 2. Clear liquid diet, advance as tolerated.     Shirley Friar, MD     VCS/MEDQ  D:  12/03/2010  T:  12/03/2010  Job:  [id number]  Electronically Signed by  Charlott Rakes MD on 01/06/2011 04:48:59 PM

## 2011-01-06 NOTE — Consult Note (Signed)
[name], [name]              ACCOUNT NO.:  [id number]  MEDICAL RECORD NO.:  [id number]  LOCATION:  1511                         FACILITY:  [facility]  PHYSICIAN:  Shirley Friar, MDDATE OF BIRTH:  30-Sep-1980  DATE OF CONSULTATION:  12/02/2010 DATE OF DISCHARGE:                                CONSULTATION   REQUESTING PHYSICIAN:  Pleas Koch, MD  INDICATIONS:  Anemia, Kaposi sarcoma, heme-positive stool, heartburn.  HISTORY OF PRESENT ILLNESS:  The patient is a 30 year old black male with HIV/AIDS and Kaposi sarcoma who was admitted on November 28, 2010, due to altered mental status.  He has been diagnosed with presumed Kaposi sarcoma in the setting of HIV/AIDS and has been medically managed for that.  His mother and aunt report he has been having heartburn for about a month but upon talking to him he states that he thinks he may have been having it longer but he is not sure.  He has also been having some vague abdominal pain that he is unable to describe but his mother states that he has been taking Pepto-Bismol for 8 months to try and help with that.  He was diagnosed with  thrush as well.  He was heme-positive and had a hemoglobin nadir of 5.8.  He denies any visible rectal bleeding. Denies any nausea, vomiting.  He has not been on treatment for his HIV until now per consultation by Dr. Myna Hidalgo.  PAST MEDICAL HISTORY: 1. AIDS and associated Kaposi sarcoma 2. 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 3. Anemia as stated above. 4. History of oral thrush.  CURRENT MEDICATIONS:  Atripla, ferrous sulfate, Diflucan, Carafate. Doses are in hospital record and additional p.r.n. medicines listed in hospital record.  ALLERGIES:  No known drug allergies.  FAMILY HISTORY:  Noted on admission H and P done on November 28, 2010, by Dr. Houston Siren, I reviewed and I agree.  SOCIAL HISTORY:  Noted on admission H and P done on November 28, 2010, by Dr. Houston Siren, I reviewed and I agree.  REVIEW  OF SYSTEMS:  Noted on admission H and P done on November 28, 2010, by Dr. Houston Siren, I reviewed and I agree.  PHYSICAL EXAMINATION:  VITAL SIGNS:  Temperature 101.4, pulse 112, blood pressure 112/70, O2 sat 93% on room air. GENERAL:  Lethargic but arousable. ABDOMEN:  Soft, nontender, nondistended.  LABORATORY DATA:  White blood count 2.4, hemoglobin 8.1, platelet count 297.  Ferritin 2450.  Iron level less than 10, CD-4 count 10.  IMPRESSION:  The patient is a 30 year old black male with acquired immunodeficiency syndrome and Kaposi sarcoma with severe anemia, heartburn and heme-positive stool.  A colonoscopy and upper endoscopy is recommended by Dr. Myna Hidalgo to stage his Kaposi sarcoma and see if he has any GI tract involvement.  We will plan to do these procedures on December 03, 2010.  We will not do a capsule endoscopy unless Oncology/ID feel that small bowel involvement would change their management.  I will be happy to do capsule endoscopy if they still feel one is needed. We will place the patient on Protonix and hold his iron pills.    Shirley Friar, MD  VCS/MEDQ  D:  12/02/2010  T:  12/02/2010  Job:  [id number]  Electronically Signed by Charlott Rakes MD on 01/06/2011 04:48:47 PM

## 2011-01-06 NOTE — Op Note (Signed)
  [name], [name]              ACCOUNT NO.:  [id number]  MEDICAL RECORD NO.:  [id number]  LOCATION:  1511                         FACILITY:  [facility]  PHYSICIAN:  Shirley Friar, MDDATE OF BIRTH:  [birth year]  DATE OF PROCEDURE:  12/03/2010 DATE OF DISCHARGE:                              OPERATIVE REPORT   PROCEDURE:  Upper endoscopy.  INDICATIONS:  Anemia, heme-positive stool, HIV/AIDS with skin manifestations of Kaposi sarcoma  MEDICATIONS:  Fentanyl 50 mcg IV, Versed 3 mg IV, Cetacaine spray x2.  FINDINGS:  Endoscope was inserted through the oropharynx and esophagus was intubated.  In the midesophagus was a large atrophic appearing area with white exudate and ulceration noted.  This area was friable to cytologic brushing.  Brushing was done on withdrawal.  On insertion, the endoscope was advanced down into the distal esophagus which was unremarkable.  Endoscope was advanced to the stomach which revealed mild erythema in the distal stomach without any ulceration or raised areas. The endoscope was retroflexed and revealed a normal-appearing proximal stomach.  The endoscope was straightened and advanced into the duodenal bulb and second portion of duodenum which was unremarkable.  Endoscope was withdrawn back into the stomach and back into the esophagus. Cytologic brushing was done which revealed this ulcerated area to be very friable.  The endoscope was then withdrawn.  ASSESSMENT: 1. Mid esophageal ulcer status post cytologic brushing to check for     herpes versus Kaposi sarcoma 2. Mild gastritis.  PLAN: 1. Proceed with colonoscopy. 2. Follow up on cytologic brushing. 3. Clear liquid diet as tolerated.     Shirley Friar, MD     VCS/MEDQ  D:  12/03/2010  T:  12/03/2010  Job:  [id number]  Electronically Signed by Charlott Rakes MD on 01/06/2011 04:48:53 PM

## 2011-01-06 NOTE — H&P (Signed)
[name], [name]              ACCOUNT NO.:  [id number]  MEDICAL RECORD NO.:  [id number]  LOCATION:  1511                         FACILITY:  [facility]  PHYSICIAN:  Houston Siren, MD           DATE OF BIRTH:  [birth year]  DATE OF ADMISSION:  11/28/2010 DATE OF DISCHARGE:                             HISTORY & PHYSICAL   PRIMARY CARE PHYSICIAN:  None.  REASON FOR ADMISSION:  Altered mental status, concern for herpes encephalitis.  ADVANCE DIRECTIVE:  Full code.  HISTORY OF PRESENT ILLNESS:  This is a 30 year old male previously healthy brought to the emergency room with altered mental status. Apparently, he was seen out of his house late in the evening and behaved "not himself."  He has not been eating and appeared to be  confused. His mother lives who lives away was contacted and got him to the hospital. She confirmed that there was a major personality change. He had last conversed with her about 2 to 3 days ago; and at that time, she said he was normal.  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  He worked in [employer] at [employer].  He is alert and oriented and conversant.  However, he has a "labelled defensive attitude." He denies any headache, nausea, vomiting, abdominal cramps or pain, or visual problems.  He did complain of rash on his right buttock area which was previously painful but now has crusted over.  Evaluation in the emergency room showed a normal white count of 5.5000 but his hemoglobin is quite low at 7.3.  Again, he had no black stool or bloody stool and family denied any prior history of anemia or sickle cell disease.  His urinalysis is negative but his serum sodium is 129. He has potassium of 3.0.  He was guaiac positive from below.  His urine drug screen was negative.  He did have a fever of 103 but his vital signs were otherwise unremarkable and he maintained hemodynamic stability.  A  head CT was done and was negative.  To exclude meningitis, lumbar puncture was performed by the emergency room physician which partial results showed rare wbc (2 wbc's and 242 rbc's).  There was no organism seen.  He reportedly had no increased risk for HIV.  Hospitalist at this time was asked to admit the patient for further evaluation and treatment.  PAST MEDICAL HISTORY:  As above, and otherwise benign.  ALLERGIES:  No known drug allergies.  SOCIAL HISTORY:  He works at the [employer].  Denies any illicit drug, alcohol, or tobacco use.  He is monogamous with his girlfriend.  FAMILY HISTORY:  No family history of any hematologic disorder.  REVIEW OF SYSTEMS:  Otherwise unremarkable, specifically other than the rash seen.  He has no joint aches or pain or any other rash.  No nausea, vomiting, headache, visual problem, sore throat.  PHYSICAL EXAMINATION:  VITAL SIGNS:  Temperature 103, blood pressure 123/74, pulse of 100, respiratory rate of 20. GENERAL:  He is not in any apparent distress.  He appears subdue but answers questions appropriately.  HEENT:  He has facial symmetry and fluent speech.  His tongue elevated with phonation.  He has no lesion in his throat.  No sinus tenderness. NECK:  There is no lymphadenopathy.  His neck is supple. CARDIAC:  S1 and S2 regular.  I did not hear any murmur, rub, or gallops. LUNGS:  Clear.  No wheezes, rales, or any evidence of consolidation. ABDOMEN:  Soft, nontender, nondistended. EXTREMITIES:  No edema.  No calf tenderness.  He has good distal pulses bilaterally.  He did have multiple maculopapular rash on his buttock area with some deep pigmentation but could be consistent with herpes infection. NEUROLOGIC:  Nonfocal, other than the fact that he behaves inappropriately and everything is funny and comical.  OBJECTIVE FINDINGS:  As above.  EKG shows sinus tachycardia at a rate of 107.  IMPRESSION:  A 30 year old white altered mental status  with no prior history of psychiatric problem and having fever of 103.  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  Less likely is meningitis.  He only had 2 wbc's in his spinal tap and that certainly argued against it. He was given Rocephin and Decadron in the emergency room, and  i will continue those, but will definitely add acyclovir at 10 mg per kg IV q.8h. as soon as possible.  We will get herpes PCR along with fungal culture. I also would like to get a Lyme disease titer, an RPR along with HIV.  For his anemia, I am unclear as to the etiology and will pursue an anemia workup to include iron, TIBC, ferritin, heptoglobin, and hemoglobin electrophoresis along with a reticulocyte count.  Because of the degree of anemia, will follow his H and H and will type and cross 2 units of packed red blood cells in case he needs to have those transfused.  I spoke with Dr. Orvan Falconer of infectious disease who will see the patient shortly for further recommendations.  He is stable, but I am quite concerned about his altered mental status, and I have relayed this feeling to his mother.  We will admit him to [facility] 4.Houston Siren, MD     PL/MEDQ  D:  11/28/2010  T:  11/28/2010  Job:  [id number]  Electronically Signed by Houston Siren  on 01/06/2011 10:12:15 PM

## 2011-01-24 LAB — FUNGUS CULTURE, BLOOD

## 2011-02-03 LAB — AFB CULTURE, BLOOD

## 2012-05-30 IMAGING — NM NM CARDIA MUGA REST
3 series · 18 of 18 positions shown · non-contrast
Comparison: None.

CLINICAL DATA: Lymphoma, pre chemotherapy evaluation.

NUCLEAR MEDICINE CARDIAC MULTIPLE UPTAKE GATED ACQUISITION SCAN
TECHNIQUE: Radiolabeled red blood cells used to perform resting
radionuclide ventriculography. Imaging performed in the anterior,
LAO, and lateral projections.Resting left ventricular ejection
fraction estimated after drawing region of interest curves around
the left ventricle during systole and diastole.
Radiopharmaceutical: 24.1 mCi technetium 99m labeled red blood
cells.

[Series 1: mu muga · 4.30mm/px · 6 of 16 frames shown (1 of 3)]
[frame 2/16]
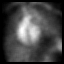
[frame 4/16]
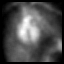
[frame 7/16]
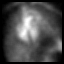
[frame 10/16]
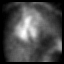
[frame 12/16]
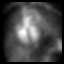
[frame 15/16]
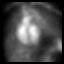

[Series 1: mu muga · 4.30mm/px · 6 of 16 frames shown (2 of 3)]
[frame 2/16  full-range]
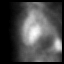
[frame 4/16  full-range]
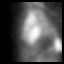
[frame 7/16  full-range]
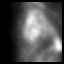
[frame 10/16  full-range]
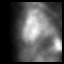
[frame 12/16  full-range]
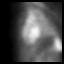
[frame 15/16  full-range]
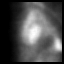

[Series 1: mu muga · 4.30mm/px · 6 of 16 frames shown (3 of 3)]
[frame 2/16]
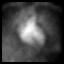
[frame 4/16]
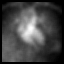
[frame 7/16]
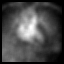
[frame 10/16]
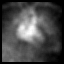
[frame 12/16]
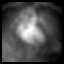
[frame 15/16]
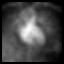

[18 of 18 positions shown; findings below may reference images not displayed]

FINDINGS: The resting left ventricular ejection fraction is
estimated by one technologist at 64% and a second technologist at
63%.  The cine images show normal wall motion.
IMPRESSION: Resting left ventricular ejection fraction approximately 64%.
